# Patient Record
Sex: Male | Born: 1990
Health system: Southern US, Community
[De-identification: ages and names within clinical notes are randomized; demographics above are authoritative.]

---

## 2015-07-02 ENCOUNTER — Encounter (HOSPITAL_COMMUNITY): Payer: Self-pay | Admitting: Emergency Medicine

## 2015-07-02 ENCOUNTER — Emergency Department (HOSPITAL_COMMUNITY)
Admission: EM | Admit: 2015-07-02 | Discharge: 2015-07-03 | Disposition: A | Discharge status: Home / Self Care | Payer: Commercial Managed Care - HMO | Attending: Emergency Medicine | Admitting: Emergency Medicine

## 2015-07-02 ENCOUNTER — Emergency Department (HOSPITAL_COMMUNITY): Payer: Commercial Managed Care - HMO

## 2015-07-02 DIAGNOSIS — R110 Nausea: Secondary | ICD-10-CM | POA: Diagnosis not present

## 2015-07-02 DIAGNOSIS — R509 Fever, unspecified: Secondary | ICD-10-CM | POA: Insufficient documentation

## 2015-07-02 DIAGNOSIS — M791 Myalgia: Secondary | ICD-10-CM | POA: Insufficient documentation

## 2015-07-02 DIAGNOSIS — R05 Cough: Secondary | ICD-10-CM | POA: Diagnosis not present

## 2015-07-02 DIAGNOSIS — E663 Overweight: Secondary | ICD-10-CM | POA: Diagnosis not present

## 2015-07-02 DIAGNOSIS — R0602 Shortness of breath: Secondary | ICD-10-CM | POA: Diagnosis not present

## 2015-07-02 DIAGNOSIS — R000 Tachycardia, unspecified: Secondary | ICD-10-CM | POA: Insufficient documentation

## 2015-07-02 DIAGNOSIS — R0981 Nasal congestion: Secondary | ICD-10-CM | POA: Diagnosis not present

## 2015-07-02 DIAGNOSIS — R531 Weakness: Secondary | ICD-10-CM | POA: Insufficient documentation

## 2015-07-02 DIAGNOSIS — R6889 Other general symptoms and signs: Secondary | ICD-10-CM

## 2015-07-02 LAB — CBC WITH DIFFERENTIAL/PLATELET
BASOS ABS: 0 10*3/uL (ref 0.0–0.1)
Basophils Relative: 0 %
Eosinophils Absolute: 0 10*3/uL (ref 0.0–0.7)
Eosinophils Relative: 0 %
HEMATOCRIT: 43.8 % (ref 39.0–52.0)
Hemoglobin: 14.2 g/dL (ref 13.0–17.0)
LYMPHS PCT: 21 %
Lymphs Abs: 1.7 10*3/uL (ref 0.7–4.0)
MCH: 27 pg (ref 26.0–34.0)
MCHC: 32.4 g/dL (ref 30.0–36.0)
MCV: 83.3 fL (ref 78.0–100.0)
MONO ABS: 1.3 10*3/uL — AB (ref 0.1–1.0)
Monocytes Relative: 16 %
NEUTROS ABS: 5.1 10*3/uL (ref 1.7–7.7)
Neutrophils Relative %: 63 %
Platelets: 254 10*3/uL (ref 150–400)
RBC: 5.26 MIL/uL (ref 4.22–5.81)
RDW: 13.1 % (ref 11.5–15.5)
WBC: 8.1 10*3/uL (ref 4.0–10.5)

## 2015-07-02 LAB — I-STAT CHEM 8, ED
BUN: 9 mg/dL (ref 6–20)
CHLORIDE: 97 mmol/L — AB (ref 101–111)
CREATININE: 1.2 mg/dL (ref 0.61–1.24)
Calcium, Ion: 1.14 mmol/L (ref 1.12–1.23)
GLUCOSE: 99 mg/dL (ref 65–99)
HCT: 49 % (ref 39.0–52.0)
HEMOGLOBIN: 16.7 g/dL (ref 13.0–17.0)
POTASSIUM: 3.8 mmol/L (ref 3.5–5.1)
Sodium: 137 mmol/L (ref 135–145)
TCO2: 25 mmol/L (ref 0–100)

## 2015-07-02 NOTE — ED Provider Notes (Signed)
CSN: [id number]     Arrival date & time 07/02/15  2203 History   By signing my name below, I, Arlan Organ, attest that this documentation has been prepared under the direction and in the presence of Shon Baton, MD.  Electronically Signed: Arlan Organ, ED Scribe. 07/03/2015. 12:10 AM.   Chief Complaint  Patient presents with  . Fever   The history is provided by the patient. No language interpreter was used.    HPI Comments: Kayler Rise is a 25 y.o. male without any pertinent past medical history who presents to the Emergency Department complaining of constant, ongoing generalized body aches x 1-2 days. Pt also reports fever of 102.7, cough, congestion, nausea, mild shortness of breath, and generalized weakness. No aggravating or alleviating factors reported. OTC medications attempted at home without any improvement. No recent vomiting, chills, abdominal pain, or diarrhea. Pt admits he may have been exposed to someone with the Flu. No known allergies to medications.  PCP: No primary care provider on file.    History reviewed. No pertinent past medical history. History reviewed. No pertinent past surgical history. History reviewed. No pertinent family history. Social History  Substance Use Topics  . Smoking status: Never Smoker   . Smokeless tobacco: None  . Alcohol Use: No    Review of Systems  Constitutional: Positive for fever. Negative for chills.  HENT: Positive for congestion.   Respiratory: Positive for cough and shortness of breath.   Cardiovascular: Negative for chest pain.  Gastrointestinal: Positive for nausea. Negative for vomiting and abdominal pain.  Musculoskeletal: Positive for myalgias.  Neurological: Positive for weakness. Negative for headaches.  Psychiatric/Behavioral: Negative for confusion.  All other systems reviewed and are negative.     Allergies  Review of patient's allergies indicates no known allergies.  Home Medications   Prior to  Admission medications   Medication Sig Start Date End Date Taking? Authorizing Provider  ibuprofen (ADVIL,MOTRIN) 600 MG tablet Take 1 tablet (600 mg total) by mouth every 6 (six) hours as needed. 07/03/15   Shon Baton, MD  oseltamivir (TAMIFLU) 75 MG capsule Take 1 capsule (75 mg total) by mouth every 12 (twelve) hours. 07/03/15   Shon Baton, MD   Triage Vitals: BP 129/69 mmHg  Pulse 88  Temp(Src) 99.2 F (37.3 C) (Oral)  Resp 16  Wt 344 lb 6.4 oz (156.219 kg)  SpO2 96%   Physical Exam  Constitutional: He is oriented to person, place, and time. He appears well-developed and well-nourished.  overweight  HENT:  Head: Normocephalic and atraumatic.  Mouth/Throat: No oropharyngeal exudate.  Eyes: Pupils are equal, round, and reactive to light.  Neck: Neck supple.  Cardiovascular: Normal rate, regular rhythm and normal heart sounds.   No murmur heard. Pulmonary/Chest: Effort normal and breath sounds normal. No respiratory distress. He has no wheezes.  Abdominal: Soft. Bowel sounds are normal. There is no tenderness. There is no rebound.  Musculoskeletal: He exhibits no edema.  Neurological: He is alert and oriented to person, place, and time.  Skin: Skin is warm and dry.  Psychiatric: He has a normal mood and affect.  Nursing note and vitals reviewed.   ED Course  Procedures (including critical care time)  DIAGNOSTIC STUDIES: Oxygen Saturation is 97% on RA, Normal by my interpretation.    COORDINATION OF CARE: 12:07 AM- Will order imaging and blood work. Discussed treatment plan with pt at bedside and pt agreed to plan.     Labs Review Labs Reviewed  CBC WITH DIFFERENTIAL/PLATELET - Abnormal; Notable for the following:    Monocytes Absolute 1.3 (*)    All other components within normal limits  I-STAT CHEM 8, ED - Abnormal; Notable for the following:    Chloride 97 (*)    All other components within normal limits    Imaging Review Dg Chest 2 View  07/02/2015   CLINICAL DATA:  Fever, coughing congestion, body aches and shortness of breath for 2 days. Nonsmoker. EXAM: CHEST  2 VIEW COMPARISON:  None. FINDINGS: Cardiomediastinal silhouette is normal. The lungs are clear without pleural effusions or focal consolidations. Trachea projects midline and there is no pneumothorax. RIGHT perihilar nodular density most compatible with pulmonary vessel en face. Soft tissue planes and included osseous structures are non-suspicious. IMPRESSION: Normal chest. Electronically Signed   By: Awilda Metroourtnay  Bloomer M.D.   On: 07/02/2015 23:01   I have personally reviewed and evaluated these images and lab results as part of my medical decision-making.   EKG Interpretation None      MDM   Final diagnoses:  Flu-like symptoms    Patient presents with flulike symptoms. Initially febrile and tachycardic. Otherwise well-appearing. No tonsillar exudate suggestive of strep. Patient reports a sick contact. Patient was given ibuprofen and tachycardia improved with temperature management. Will discharge him with Tamiflu. Given strict return precautions.  After history, exam, and medical workup I feel the patient has been appropriately medically screened and is safe for discharge home. Pertinent diagnoses were discussed with the patient. Patient was given return precautions.  I personally performed the services described in this documentation, which was scribed in my presence. The recorded information has been reviewed and is accurate.   Shon Batonourtney F Deyra Perdomo, MD 07/03/15 [phone]

## 2015-07-02 NOTE — ED Notes (Signed)
Patient here with two day history of fever and not feeling well.  Patient states that he has been taking daytime cold and flu medicine OTC at home.

## 2015-07-03 MED ORDER — IBUPROFEN 600 MG PO TABS: 600.0000 mg | ORAL_TABLET | Freq: Four times a day (QID) | ORAL | Status: AC | PRN

## 2015-07-03 MED ORDER — OSELTAMIVIR PHOSPHATE 75 MG PO CAPS
75.0000 mg | ORAL_CAPSULE | Freq: Two times a day (BID) | ORAL | Status: AC
Start: 2015-07-03 — End: ?

## 2015-07-03 MED ORDER — IBUPROFEN 400 MG PO TABS
600.0000 mg | ORAL_TABLET | Freq: Once | ORAL | Status: AC
  Administered 2015-07-03: 600 mg via ORAL
  Filled 2015-07-03: qty 1

## 2015-07-03 NOTE — Discharge Instructions (Signed)
Viral Infections °A viral infection can be caused by different types of viruses. Most viral infections are not serious and resolve on their own. However, some infections may cause severe symptoms and may lead to further complications. °SYMPTOMS °Viruses can frequently cause: °· Minor sore throat. °· Aches and pains. °· Headaches. °· Runny nose. °· Different types of rashes. °· Watery eyes. °· Tiredness. °· Cough. °· Loss of appetite. °· Gastrointestinal infections, resulting in nausea, vomiting, and diarrhea. °These symptoms do not respond to antibiotics because the infection is not caused by bacteria. However, you might catch a bacterial infection following the viral infection. This is sometimes called a "superinfection." Symptoms of such a bacterial infection may include: °· Worsening sore throat with pus and difficulty swallowing. °· Swollen neck glands. °· Chills and a high or persistent fever. °· Severe headache. °· Tenderness over the sinuses. °· Persistent overall ill feeling (malaise), muscle aches, and tiredness (fatigue). °· Persistent cough. °· Yellow, green, or brown mucus production with coughing. °HOME CARE INSTRUCTIONS  °· Only take over-the-counter or prescription medicines for pain, discomfort, diarrhea, or fever as directed by your caregiver. °· Drink enough water and fluids to keep your urine clear or pale yellow. Sports drinks can provide valuable electrolytes, sugars, and hydration. °· Get plenty of rest and maintain proper nutrition. Soups and broths with crackers or rice are fine. °SEEK IMMEDIATE MEDICAL CARE IF:  °· You have severe headaches, shortness of breath, chest pain, neck pain, or an unusual rash. °· You have uncontrolled vomiting, diarrhea, or you are unable to keep down fluids. °· You or your child has an oral temperature above 102° F (38.9° C), not controlled by medicine. °· Your baby is older than 3 months with a rectal temperature of 102° F (38.9° C) or higher. °· Your baby is 3  months old or younger with a rectal temperature of 100.4° F (38° C) or higher. °MAKE SURE YOU:  °· Understand these instructions. °· Will watch your condition. °· Will get help right away if you are not doing well or get worse. °  °This information is not intended to replace advice given to you by your health care provider. Make sure you discuss any questions you have with your health care provider. °  °Document Released: 01/03/2005 Document Revised: 06/18/2011 Document Reviewed: 09/01/2014 °[employer]vier Interactive Patient Education ©2016 [employer] ° °

## 2017-06-21 IMAGING — DX DG CHEST 2V
2 series · 2 of 2 positions shown · non-contrast
Comparison: None.

CLINICAL DATA: Fever, coughing congestion, body aches and shortness
of breath for 2 days. Nonsmoker.

EXAM:
CHEST  2 VIEW

[w chest pa]
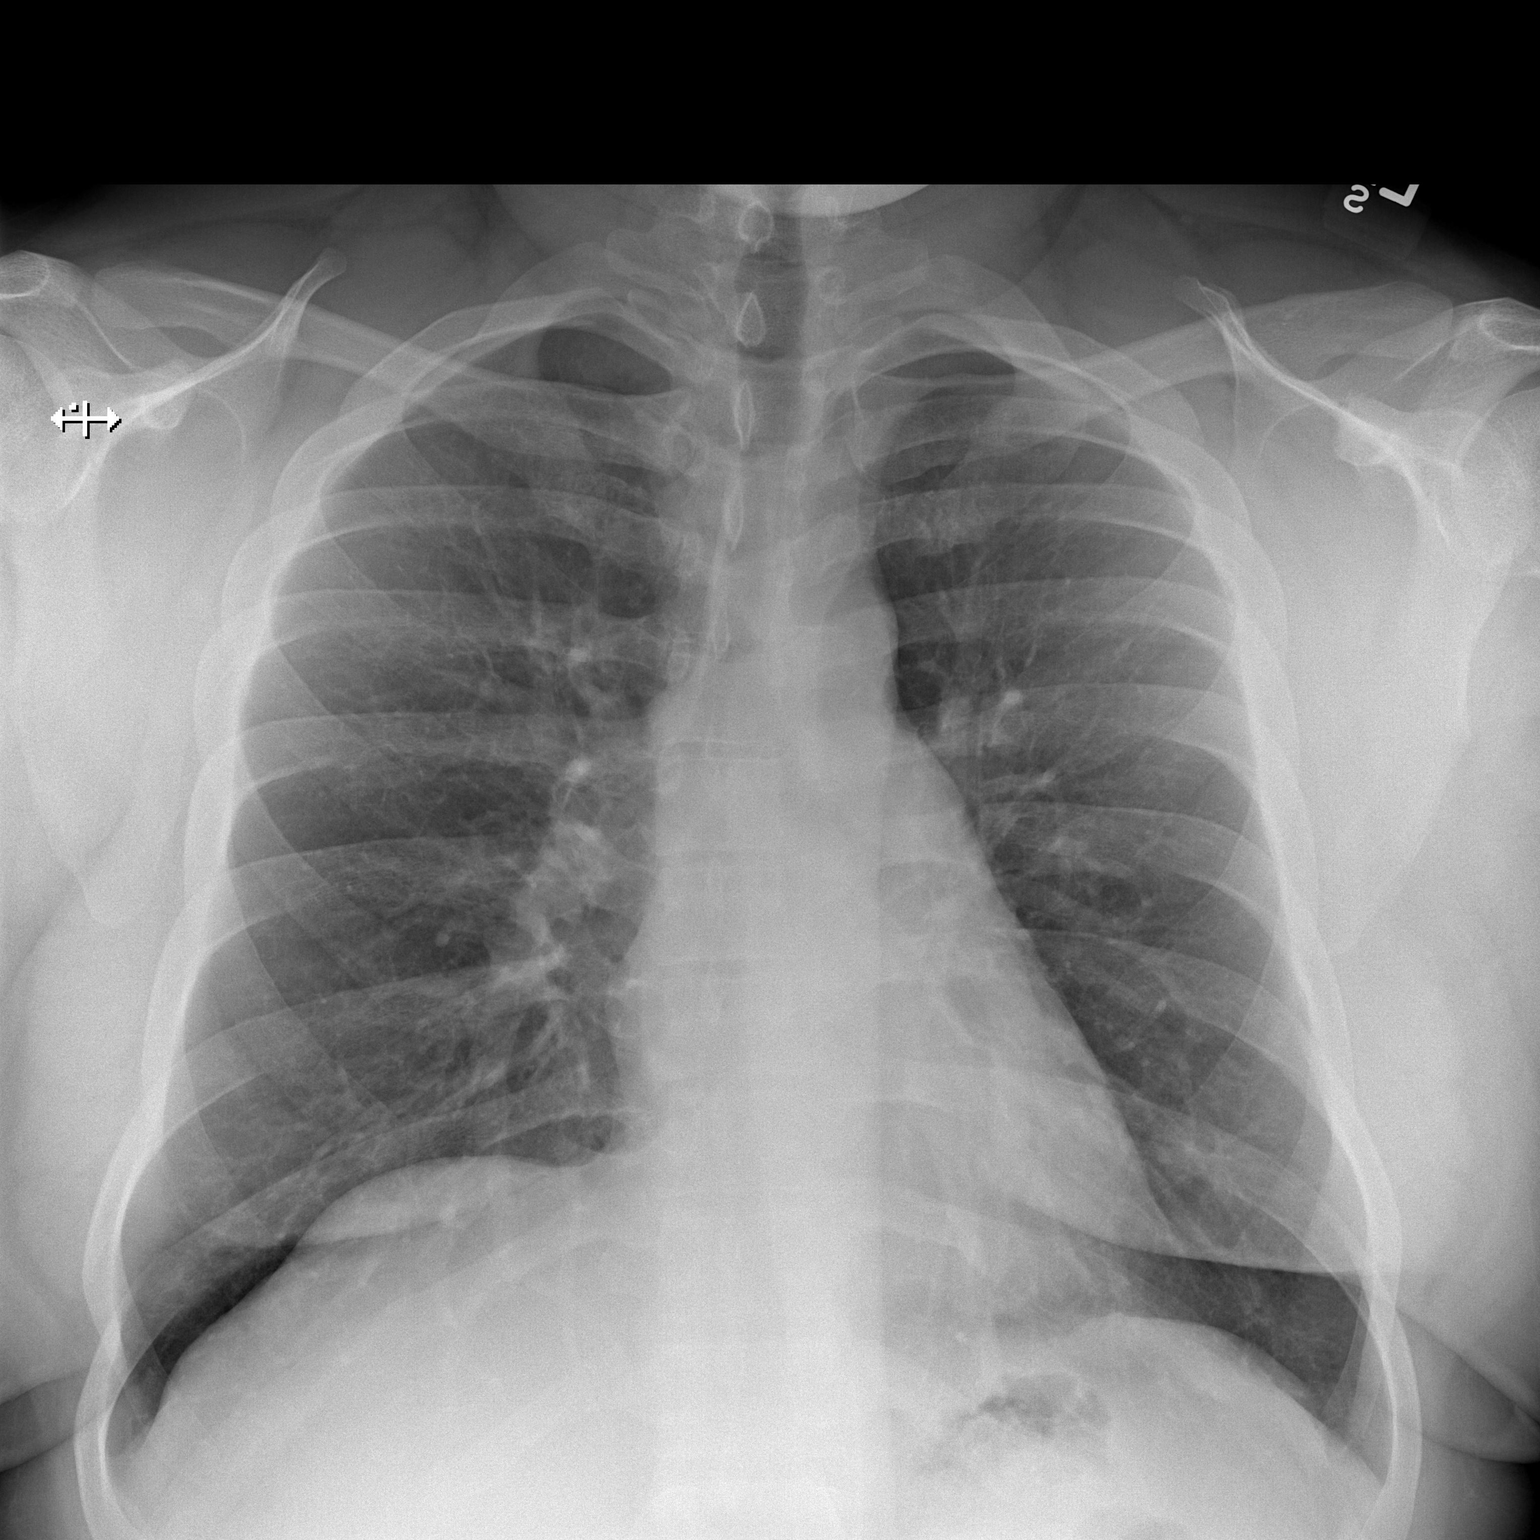

[w chest lat]
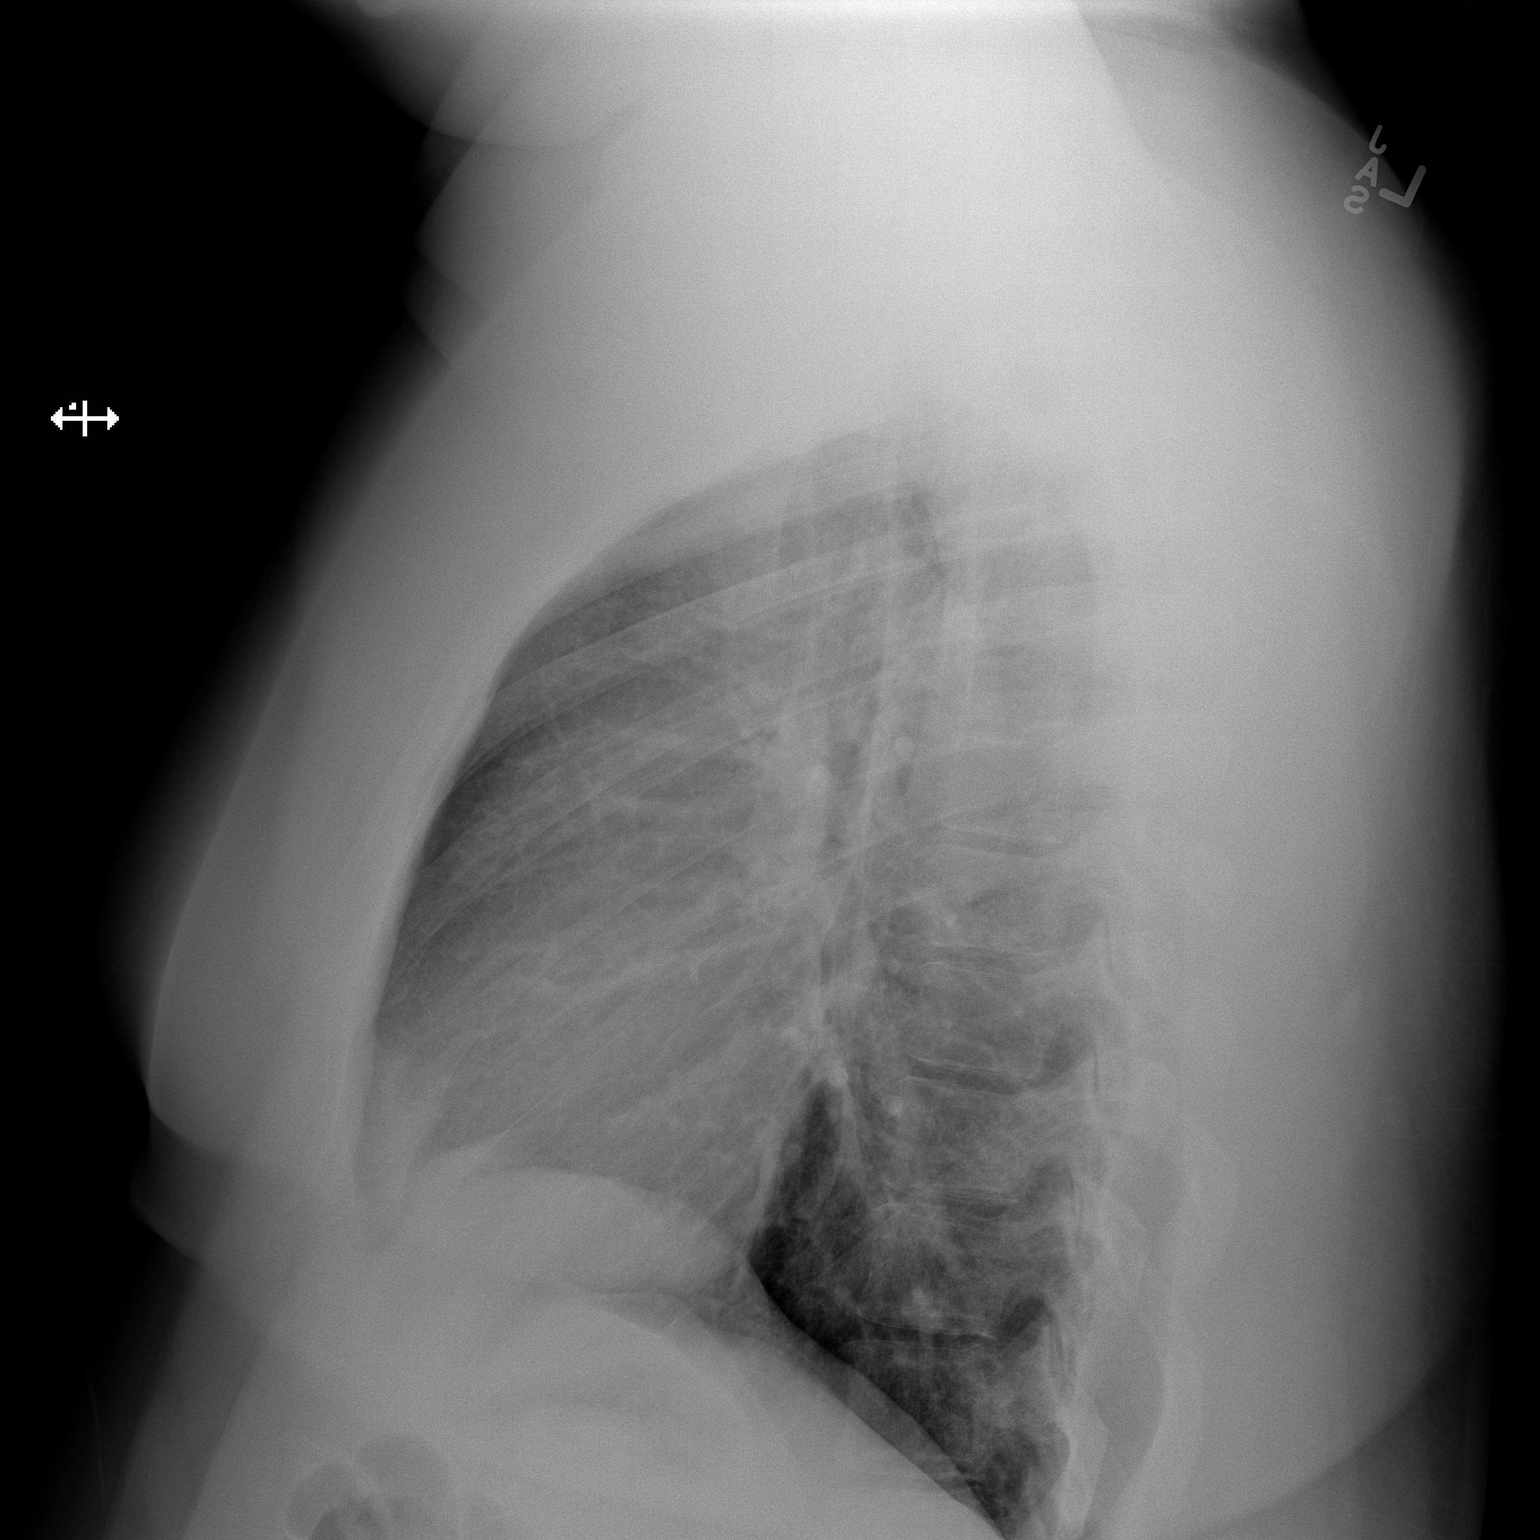

[2 of 2 positions shown; findings below may reference images not displayed]

FINDINGS: Cardiomediastinal silhouette is normal. The lungs are clear without
pleural effusions or focal consolidations. Trachea projects midline
and there is no pneumothorax. RIGHT perihilar nodular density most
compatible with pulmonary vessel en face. Soft tissue planes and
included osseous structures are non-suspicious.
IMPRESSION: Normal chest.
